# Patient Record
Sex: Male | Born: 1995 | Race: Black or African American | Hispanic: No | Marital: Single | Smoking: Never smoker
Health system: Southern US, Community
[De-identification: ages and names within clinical notes are randomized; demographics above are authoritative.]

## PROBLEM LIST (undated history)

## (undated) DIAGNOSIS — T7840XA Allergy, unspecified, initial encounter: Secondary | ICD-10-CM

## (undated) HISTORY — DX: Allergy, unspecified, initial encounter: T78.40XA

---
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
## 2020-02-13 ENCOUNTER — Inpatient Hospital Stay (HOSPITAL_COMMUNITY)
Admission: EM | Admit: 2020-02-13 | Discharge: 2020-02-16 | DRG: 558 | Disposition: A | Discharge status: Home / Self Care | Payer: No Typology Code available for payment source | Attending: Internal Medicine | Admitting: Internal Medicine

## 2020-02-13 ENCOUNTER — Encounter (HOSPITAL_COMMUNITY): Payer: Self-pay

## 2020-02-13 ENCOUNTER — Other Ambulatory Visit: Payer: Self-pay

## 2020-02-13 DIAGNOSIS — F17200 Nicotine dependence, unspecified, uncomplicated: Secondary | ICD-10-CM | POA: Diagnosis not present

## 2020-02-13 DIAGNOSIS — M6282 Rhabdomyolysis: Principal | ICD-10-CM

## 2020-02-13 DIAGNOSIS — Z20822 Contact with and (suspected) exposure to covid-19: Secondary | ICD-10-CM | POA: Diagnosis present

## 2020-02-13 DIAGNOSIS — R7989 Other specified abnormal findings of blood chemistry: Secondary | ICD-10-CM | POA: Diagnosis present

## 2020-02-13 DIAGNOSIS — R809 Proteinuria, unspecified: Secondary | ICD-10-CM | POA: Diagnosis present

## 2020-02-13 DIAGNOSIS — F1210 Cannabis abuse, uncomplicated: Secondary | ICD-10-CM | POA: Diagnosis present

## 2020-02-13 DIAGNOSIS — R748 Abnormal levels of other serum enzymes: Secondary | ICD-10-CM | POA: Diagnosis present

## 2020-02-13 DIAGNOSIS — F17290 Nicotine dependence, other tobacco product, uncomplicated: Secondary | ICD-10-CM | POA: Diagnosis present

## 2020-02-13 LAB — HEPATITIS PANEL, ACUTE
HCV Ab: NONREACTIVE
Hep A IgM: NONREACTIVE
Hep B C IgM: NONREACTIVE
Hepatitis B Surface Ag: NONREACTIVE

## 2020-02-13 LAB — CBC
HCT: 40.6 % (ref 39.0–52.0)
Hemoglobin: 13.8 g/dL (ref 13.0–17.0)
MCH: 28.5 pg (ref 26.0–34.0)
MCHC: 34 g/dL (ref 30.0–36.0)
MCV: 83.9 fL (ref 80.0–100.0)
Platelets: 162 10*3/uL (ref 150–400)
RBC: 4.84 MIL/uL (ref 4.22–5.81)
RDW: 13.1 % (ref 11.5–15.5)
WBC: 7 10*3/uL (ref 4.0–10.5)
nRBC: 0 % (ref 0.0–0.2)

## 2020-02-13 LAB — CBC WITH DIFFERENTIAL/PLATELET
Abs Immature Granulocytes: 0.02 10*3/uL (ref 0.00–0.07)
Basophils Absolute: 0 10*3/uL (ref 0.0–0.1)
Basophils Relative: 0 %
Eosinophils Absolute: 0.1 10*3/uL (ref 0.0–0.5)
Eosinophils Relative: 1 %
HCT: 41.5 % (ref 39.0–52.0)
Hemoglobin: 14.3 g/dL (ref 13.0–17.0)
Immature Granulocytes: 0 %
Lymphocytes Relative: 28 %
Lymphs Abs: 1.9 10*3/uL (ref 0.7–4.0)
MCH: 28.5 pg (ref 26.0–34.0)
MCHC: 34.5 g/dL (ref 30.0–36.0)
MCV: 82.8 fL (ref 80.0–100.0)
Monocytes Absolute: 0.5 10*3/uL (ref 0.1–1.0)
Monocytes Relative: 7 %
Neutro Abs: 4.4 10*3/uL (ref 1.7–7.7)
Neutrophils Relative %: 64 %
Platelets: 236 10*3/uL (ref 150–400)
RBC: 5.01 MIL/uL (ref 4.22–5.81)
RDW: 13.2 % (ref 11.5–15.5)
WBC: 7 10*3/uL (ref 4.0–10.5)
nRBC: 0 % (ref 0.0–0.2)

## 2020-02-13 LAB — CREATININE, SERUM
Creatinine, Ser: 1.03 mg/dL (ref 0.61–1.24)
GFR calc Af Amer: >60 mL/min (ref >60)
GFR calc non Af Amer: >60 mL/min (ref >60)

## 2020-02-13 LAB — COMPREHENSIVE METABOLIC PANEL
ALT: 361 U/L — ABNORMAL HIGH (ref 0–44)
AST: 1449 U/L — ABNORMAL HIGH (ref 15–41)
Albumin: 4.2 g/dL (ref 3.5–5.0)
Alkaline Phosphatase: 59 U/L (ref 38–126)
Anion gap: 12 (ref 5–15)
BUN: 13 mg/dL (ref 6–20)
CO2: 25 mmol/L (ref 22–32)
Calcium: 8.9 mg/dL (ref 8.9–10.3)
Chloride: 102 mmol/L (ref 98–111)
Creatinine, Ser: 1.03 mg/dL (ref 0.61–1.24)
GFR calc Af Amer: >60 mL/min (ref >60)
GFR calc non Af Amer: >60 mL/min (ref >60)
Glucose, Bld: 109 mg/dL — ABNORMAL HIGH (ref 70–99)
Potassium: 4.4 mmol/L (ref 3.5–5.1)
Sodium: 139 mmol/L (ref 135–145)
Total Bilirubin: 0.7 mg/dL (ref 0.3–1.2)
Total Protein: 7.8 g/dL (ref 6.5–8.1)

## 2020-02-13 LAB — URINALYSIS, ROUTINE W REFLEX MICROSCOPIC
Bilirubin Urine: NEGATIVE
Glucose, UA: NEGATIVE mg/dL
Ketones, ur: NEGATIVE mg/dL
Leukocytes,Ua: NEGATIVE
Nitrite: NEGATIVE
Protein, ur: 100 mg/dL — AB
Specific Gravity, Urine: 1.023 (ref 1.005–1.030)
pH: 6 (ref 5.0–8.0)

## 2020-02-13 LAB — RESPIRATORY PANEL BY RT PCR (FLU A&B, COVID)
Influenza A by PCR: NEGATIVE
Influenza B by PCR: NEGATIVE
SARS Coronavirus 2 by RT PCR: NEGATIVE

## 2020-02-13 LAB — CK: Total CK: >50000 U/L — ABNORMAL HIGH (ref 49–397)

## 2020-02-13 LAB — HIV ANTIBODY (ROUTINE TESTING W REFLEX): HIV Screen 4th Generation wRfx: NONREACTIVE

## 2020-02-13 MED ORDER — NICOTINE 14 MG/24HR TD PT24
14.0000 mg | MEDICATED_PATCH | Freq: Every day | TRANSDERMAL | Status: DC
  Filled 2020-02-13 (×2): qty 1

## 2020-02-13 MED ORDER — SODIUM CHLORIDE 0.9 % IV SOLN: INTRAVENOUS | Status: DC

## 2020-02-13 MED ORDER — ENOXAPARIN SODIUM 60 MG/0.6ML ~~LOC~~ SOLN
0.5000 mg/kg | SUBCUTANEOUS | Status: DC
  Administered 2020-02-13 – 2020-02-14 (×2): 60 mg via SUBCUTANEOUS
  Filled 2020-02-13 (×3): qty 0.6

## 2020-02-13 MED ORDER — SODIUM CHLORIDE 0.9 % IV BOLUS
1000.0000 mL | Freq: Once | INTRAVENOUS | Status: AC
  Administered 2020-02-13: 1000 mL via INTRAVENOUS

## 2020-02-13 MED ORDER — ONDANSETRON HCL 4 MG/2ML IJ SOLN: 4.0000 mg | Freq: Four times a day (QID) | INTRAMUSCULAR | Status: DC | PRN

## 2020-02-13 MED ORDER — ONDANSETRON HCL 4 MG PO TABS: 4.0000 mg | ORAL_TABLET | Freq: Four times a day (QID) | ORAL | Status: DC | PRN

## 2020-02-13 NOTE — ED Provider Notes (Signed)
[facility]-EMERGENCY DEPT Provider Note   CSN: [id number] Arrival date & time: 02/13/20  0865     History Chief Complaint  Patient presents with  . Muscle Pain    Gregg Lowery is a 24 y.o. male who presents to ED with a chief complaint of myalgias and hematuria.  States that he did a strenuous workout 3 days ago after a long time.  He reports waking up the next day with diffuse myalgias and started having hematuria since yesterday.  States that he used to play football and had similar symptoms in the past but "not as severe."  He denies specific chest pain, abdominal pain, vomiting, diarrhea or shortness of breath.  He feels that he is staying adequately hydrated.  Denies any injuries or falls, cough or sick contacts with similar symptoms.  HPI     Past Medical History:  Diagnosis Date  . Allergy     Patient Active Problem List   Diagnosis Date Noted  . 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 ORCHIOPEXY Bilateral 06/24/2014   Procedure: ORCHIOPEXY ADULT;  Surgeon: Martina Sinner, MD;  Location: [facility];  Service: Urology;  Laterality: Bilateral;  . SCROTAL EXPLORATION Bilateral 06/24/2014   Procedure: SCROTUM EXPLORATION;  Surgeon: Martina Sinner, MD;  Location: [facility];  Service: Urology;  Laterality: Bilateral;       No family history on file.  Social History   Tobacco Use  . Smoking status: Never Smoker  . Smokeless tobacco: Never Used  Substance Use Topics  . Alcohol use: No  . Drug use: No    Home Medications Prior to Admission medications   Not on File    Allergies    Patient has no known allergies.  Review of Systems   Review of Systems  Constitutional: Negative for appetite change, chills and fever.  HENT: Negative for ear pain, rhinorrhea, sneezing and sore throat.   Eyes: Negative for photophobia and visual disturbance.  Respiratory: Negative for cough, chest tightness,  shortness of breath and wheezing.   Cardiovascular: Negative for chest pain and palpitations.  Gastrointestinal: Negative for abdominal pain, blood in stool, constipation, diarrhea, nausea and vomiting.  Genitourinary: Positive for hematuria. Negative for dysuria and urgency.  Musculoskeletal: Positive for myalgias.  Skin: Negative for rash.  Neurological: Negative for dizziness, weakness and light-headedness.    Physical Exam Updated Vital Signs BP (!) 142/90 (BP Location: Right Arm)   Pulse 76   Temp 98.4 F (36.9 C) (Oral)   Resp 17   SpO2 100%   Physical Exam Vitals and nursing note reviewed.  Constitutional:      General: He is not in acute distress.    Appearance: He is well-developed.  HENT:     Head: Normocephalic and atraumatic.     Nose: Nose normal.  Eyes:     General: No scleral icterus.       Right eye: No discharge.        Left eye: No discharge.     Conjunctiva/sclera: Conjunctivae normal.  Cardiovascular:     Rate and Rhythm: Normal rate and regular rhythm.     Heart sounds: Normal heart sounds. No murmur heard.  No friction rub. No gallop.   Pulmonary:     Effort: Pulmonary effort is normal. No respiratory distress.     Breath sounds: Normal breath sounds.  Abdominal:     General: Bowel sounds are normal. There  is no distension.     Palpations: Abdomen is soft.     Tenderness: There is no abdominal tenderness. There is no guarding.  Musculoskeletal:        General: Normal range of motion.     Cervical back: Normal range of motion and neck supple.  Skin:    General: Skin is warm and dry.     Findings: No rash.  Neurological:     Mental Status: He is alert.     Motor: No abnormal muscle tone.     Coordination: Coordination normal.     ED Results / Procedures / Treatments   Labs (all labs ordered are listed, but only abnormal results are displayed) Labs Reviewed  URINALYSIS, ROUTINE W REFLEX MICROSCOPIC - Abnormal; Notable for the following  components:      Result Value   Hgb urine dipstick LARGE (*)    Protein, ur 100 (*)    Bacteria, UA RARE (*)    All other components within normal limits  CK - Abnormal; Notable for the following components:   Total CK >50,000 (*)    All other components within normal limits  COMPREHENSIVE METABOLIC PANEL - Abnormal; Notable for the following components:   Glucose, Bld 109 (*)    AST 1,449 (*)    ALT 361 (*)    All other components within normal limits  RESPIRATORY PANEL BY RT PCR (FLU A&B, COVID)  CBC WITH DIFFERENTIAL/PLATELET  HEPATITIS PANEL, ACUTE    EKG None  Radiology No results found.  Procedures Procedures (including critical care time)  Medications Ordered in ED Medications  sodium chloride 0.9 % bolus 1,000 mL (has no administration in time range)  sodium chloride 0.9 % bolus 1,000 mL (1,000 mLs Intravenous New Bag/Given (Non-Interop) 02/13/20 1022)    ED Course  I have reviewed the triage vital signs and the nursing notes.  Pertinent labs & imaging results that were available during my care of the patient were reviewed by me and considered in my medical decision making (see chart for details).  Clinical Course as of Feb 12 1145  Fri Feb 13, 2020  1139 AST(!): 1,449 [HK]  1139 ALT(!): 361 [HK]  1139 CK Total(!): >50,000 [HK]    Clinical Course User Index [HK] Dietrich Pates, PA-C   MDM Rules/Calculators/A&P                          24 year old male with a chief complaint of myalgias and hematuria.  Did a strenuous workout 3 days ago after not working out for a long time.  Woke up the next day with diffuse myalgias and hematuria since yesterday.  Denies chest pain, abdominal pain, vomiting or fever.  On exam lungs are clear to auscultation bilaterally.  Vital signs without any abnormalities.  Lab work here significant for AST of 1400, ALT of 360.  BUN/creatinine are normal here.  Urinalysis with hemoglobin.  CK greater than 50,000.  CBC is unremarkable.  I  have obtained a hepatitis panel but I feel that his transaminitis is more so related to his rhabdomyolysis.  I have ordered IV fluids and patient will need to be admitted for ongoing management of his rhabdomyolysis.  Portions of this note were generated with Scientist, clinical (histocompatibility and immunogenetics). Dictation errors may occur despite best attempts at proofreading.  Final Clinical Impression(s) / ED Diagnoses Final diagnoses:  Non-traumatic rhabdomyolysis    Rx / DC Orders ED Discharge Orders  None       Dietrich Pates, PA-C 02/13/20 1146    Linwood Dibbles, MD 02/13/20 [id number]

## 2020-02-13 NOTE — ED Triage Notes (Signed)
Patient arrived stating that on Tuesday morning he started working out again, reports dark colored urine and muscle soreness.

## 2020-02-13 NOTE — H&P (Signed)
.  History and Physical    Gregg Lowery CWC:[id number] DOB: 1995/06/23 DOA: 02/13/2020  PCP: Pcp, No  Patient coming from: Home  Chief Complaint: Muscle pain, fatigue, dark urine.  HPI: Gregg Lowery is a 24 y.o. male with no previous medical history. Present to hospital w/ 24 hours or muscle pain and fatigue. He reports 2 days ago he attended a vigorous work out session with is cousin. He seemed to feel fine the first day, but woke up yesterday with extreme fatigue and pain in his upper torso. I dismissed it as post-workout pain and tried to go to work. However, he was unable to tolerate the pain. He went home and took some ibuprofen, which helped a small amount. This morning he found himself with continued soreness. He reports that he noticed last night that his urine had become dark brown. This continued through this morning. He became concerned that something may be wrong; so, he looked up his symptoms online. He then decided to come to the ED. He denies any other alleviating or aggravating factors.   ED Course: Labwork showed elevated LFTs and CPK. TRH was called for admission.   Review of Systems: Denies CP, N, V, ab pain, dyspnea, syncopal episodes. Review of systems is otherwise negative for all not mentioned in HPI.   Past Medical History:  Diagnosis Date  . Allergy     Past Surgical History:  Procedure Laterality Date  . ORCHIOPEXY Bilateral 06/24/2014   Procedure: ORCHIOPEXY ADULT;  Surgeon: Martina Sinner, MD;  Location: [facility];  Service: Urology;  Laterality: Bilateral;  . SCROTAL EXPLORATION Bilateral 06/24/2014   Procedure: SCROTUM EXPLORATION;  Surgeon: Martina Sinner, MD;  Location: [facility];  Service: Urology;  Laterality: Bilateral;     reports that he has never smoked. He has never used smokeless tobacco. He reports that he does not drink alcohol and does not use drugs.  No Known Allergies  No family history on file.  Prior to Admission medications   Not  on File    Physical Exam: Vitals:   02/13/20 0704 02/13/20 1201  BP: (!) 142/90 (!) 144/83  Pulse: 76 81  Resp: 17 15  Temp: 98.4 F (36.9 C)   TempSrc: Oral   SpO2: 100% 100%    General: 24 y.o. male resting in bed in NAD Eyes: PERRL, normal sclera ENMT: Nares patent w/o discharge, orophaynx clear, dentition normal, ears w/o discharge/lesions/ulcers Neck: Supple, trachea midline Cardiovascular: RRR, +S1, S2, no m/g/r, equal pulses throughout Respiratory: CTABL, no w/r/r, normal WOB GI: BS+, NDNT, no masses noted, no organomegaly noted MSK: No e/c/c Skin: No rashes, bruises, ulcerations noted Neuro: A&O x 3, no focal deficits Psyc: Appropriate interaction and affect, calm/cooperative  Labs on Admission: I have personally reviewed following labs and imaging studies  CBC: Recent Labs  Lab 02/13/20 1020  WBC 7.0  NEUTROABS 4.4  HGB 14.3  HCT 41.5  MCV 82.8  PLT 236   Basic Metabolic Panel: Recent Labs  Lab 02/13/20 1020  NA 139  K 4.4  CL 102  CO2 25  GLUCOSE 109*  BUN 13  CREATININE 1.03  CALCIUM 8.9   GFR: CrCl cannot be calculated (Unknown ideal weight.). Liver Function Tests: Recent Labs  Lab 02/13/20 1020  AST 1,449*  ALT 361*  ALKPHOS 59  BILITOT 0.7  PROT 7.8  ALBUMIN 4.2   No results for input(s): LIPASE, AMYLASE in the last 168 hours. No results for input(s): AMMONIA in  the last 168 hours. Coagulation Profile: No results for input(s): INR, PROTIME in the last 168 hours. Cardiac Enzymes: Recent Labs  Lab 02/13/20 1020  CKTOTAL >50,000*   BNP (last 3 results) No results for input(s): PROBNP in the last 8760 hours. HbA1C: No results for input(s): HGBA1C in the last 72 hours. CBG: No results for input(s): GLUCAP in the last 168 hours. Lipid Profile: No results for input(s): CHOL, HDL, LDLCALC, TRIG, CHOLHDL, LDLDIRECT in the last 72 hours. Thyroid Function Tests: No results for input(s): TSH, T4TOTAL, FREET4, T3FREE, THYROIDAB in  the last 72 hours. Anemia Panel: No results for input(s): VITAMINB12, FOLATE, FERRITIN, TIBC, IRON, RETICCTPCT in the last 72 hours. Urine analysis:    Component Value Date/Time   COLORURINE YELLOW 02/13/2020 0956   APPEARANCEUR CLEAR 02/13/2020 0956   LABSPEC 1.023 02/13/2020 0956   PHURINE 6.0 02/13/2020 0956   GLUCOSEU NEGATIVE 02/13/2020 0956   HGBUR LARGE (A) 02/13/2020 0956   BILIRUBINUR NEGATIVE 02/13/2020 0956   BILIRUBINUR neg 01/02/2012 1322   KETONESUR NEGATIVE 02/13/2020 0956   PROTEINUR 100 (A) 02/13/2020 0956   UROBILINOGEN 1.0 06/23/2014 2253   NITRITE NEGATIVE 02/13/2020 0956   LEUKOCYTESUR NEGATIVE 02/13/2020 0956    Radiological Exams on Admission: No results found.  Assessment/Plan Rhabdomyolysis     - admit to inpatient, med-tele     - fluids     - trend CPK  Elevated LFTs     - likely secondary to rhabdo; hep panel is pending; trend for now  Proteinuria     - likely secondary to rhabdo; can repeat UA once rhabdo improved; if persistent, improve BP control and follow outpt with PCP  Nicotine dependence     - vapes nicotine; cessation counseled  Marijuana abuse     - cessation counseled  DVT prophylaxis: lovenox  Code Status: FULL  Family Communication: None at bedside.  Consults called: None  Admission status: Inpatient d/t severity of illness and need for IVF.   Status is: Inpatient  Remains inpatient appropriate because:IV treatments appropriate due to intensity of illness.  Dispo: The patient is from: Home              Anticipated d/c is to: Home              Anticipated d/c date is: 3 days              Patient currently is not medically stable to d/c.  Teddy Spike DO [facility]  If 7PM-7AM, please contact night-coverage [email or web address]  02/13/2020, 12:46 PM

## 2020-02-13 NOTE — ED Notes (Signed)
ED TO INPATIENT HANDOFF REPORT  Name/Age/Gender Gregg Lowery 24 y.o. male  Code Status Code Status History    Date Active Date Inactive Code Status Order ID Comments User Context   06/24/2014 2112 06/25/2014 1301 Full Code [id number]  Martina Sinner, MD Inpatient   Advance Care Planning Activity    Questions for Most Recent Historical Code Status (Order [id number])       Home/SNF/Other Home  Chief Complaint Rhabdomyolysis [M62.82]  Level of Care/Admitting Diagnosis ED Disposition    ED Disposition Condition Comment   Admit  Hospital Area: [facility] [[id number]]  Level of Care: Med-Surg [16]  May admit patient to [facility] or [facility] if equivalent level of care is available:: No  Covid Evaluation: Asymptomatic Screening Protocol (No Symptoms)  Diagnosis: Rhabdomyolysis [728.88.ICD-9-CM]  Admitting Physician: Teddy Spike [[id number]]  Attending Physician: Teddy Spike [[id number]]  Estimated length of stay: past midnight tomorrow  Certification:: I certify this patient will need inpatient services for at least 2 midnights       Medical History Past Medical History:  Diagnosis Date  . Allergy     Allergies No Known Allergies  IV Location/Drains/Wounds Patient Lines/Drains/Airways Status    Active Line/Drains/Airways    Name Placement date Placement time Site Days   Peripheral IV 02/13/20 Left Antecubital 02/13/20  1022  Antecubital  less than 1   Incision (Closed) 06/24/14 Perineum Other (Comment) 06/24/14  1831   2060          Labs/Imaging Results for orders placed or performed during the hospital encounter of 02/13/20 (from the past 48 hour(s))  Urinalysis, Routine w reflex microscopic     Status: Abnormal   Collection Time: 02/13/20  9:56 AM  Result Value Ref Range   Color, Urine YELLOW YELLOW   APPearance CLEAR CLEAR   Specific Gravity, Urine 1.023 1.005 - 1.030   pH 6.0 5.0 - 8.0   Glucose, UA NEGATIVE NEGATIVE mg/dL   Hgb  urine dipstick LARGE (A) NEGATIVE   Bilirubin Urine NEGATIVE NEGATIVE   Ketones, ur NEGATIVE NEGATIVE mg/dL   Protein, ur 867 (A) NEGATIVE mg/dL   Nitrite NEGATIVE NEGATIVE   Leukocytes,Ua NEGATIVE NEGATIVE   RBC / HPF 0-5 0 - 5 RBC/hpf   WBC, UA 0-5 0 - 5 WBC/hpf   Bacteria, UA RARE (A) NONE SEEN   Squamous Epithelial / LPF 0-5 0 - 5   Mucus PRESENT     Comment: Performed at [facility], [address], [place], [place] [address]  [place]     Status: Abnormal   Collection Time: 02/13/20 10:20 AM  Result Value Ref Range   Total CK >50,000 (H) 49.0 - 397.0 U/L    Comment: RESULTS CONFIRMED BY MANUAL DILUTION Performed at [facility], [address], [place], [place] [address]   CBC with Differential     Status: None   Collection Time: 02/13/20 10:20 AM  Result Value Ref Range   WBC 7.0 4.0 - 10.5 K/uL   RBC 5.01 4.22 - 5.81 MIL/uL   Hemoglobin 14.3 13.0 - 17.0 g/dL   HCT 28.3 39 - 52 %   MCV 82.8 80.0 - 100.0 fL   MCH 28.5 26.0 - 34.0 pg   MCHC 34.5 30.0 - 36.0 g/dL   RDW 66.2 94.7 - 65.4 %   Platelets 236 150 - 400 K/uL   nRBC 0.0 0.0 - 0.2 %   Neutrophils Relative % 64 %  Neutro Abs 4.4 1.7 - 7.7 K/uL   Lymphocytes Relative 28 %   Lymphs Abs 1.9 0.7 - 4.0 K/uL   Monocytes Relative 7 %   Monocytes Absolute 0.5 0 - 1 K/uL   Eosinophils Relative 1 %   Eosinophils Absolute 0.1 0 - 0 K/uL   Basophils Relative 0 %   Basophils Absolute 0.0 0 - 0 K/uL   Immature Granulocytes 0 %   Abs Immature Granulocytes 0.02 0.00 - 0.07 K/uL    Comment: Performed at [facility], [address], [place], [place] [address]  Comprehensive metabolic panel     Status: Abnormal   Collection Time: 02/13/20 10:20 AM  Result Value Ref Range   Sodium 139 135 - 145 mmol/L   Potassium 4.4 3.5 - 5.1 mmol/L   Chloride 102 98 - 111 mmol/L   CO2 25 22 - 32 mmol/L   Glucose, Bld 109 (H) 70 - 99 mg/dL    Comment: Glucose reference range applies  only to samples taken after fasting for at least 8 hours.   BUN 13 6 - 20 mg/dL   Creatinine, Ser 6.07 0.61 - 1.24 mg/dL   Calcium 8.9 8.9 - 37.1 mg/dL   Total Protein 7.8 6.5 - 8.1 g/dL   Albumin 4.2 3.5 - 5.0 g/dL   AST 0,626 (H) 15 - 41 U/L   ALT 361 (H) 0 - 44 U/L   Alkaline Phosphatase 59 38 - 126 U/L   Total Bilirubin 0.7 0.3 - 1.2 mg/dL   GFR calc non Af Amer >60 >60 mL/min   GFR calc Af Amer >60 >60 mL/min   Anion gap 12 5 - 15    Comment: Performed at [facility], [address], [place], [place] [address]   No results found.  Pending Labs [employer] (From admission, onward)          Start     Ordered   02/13/20 1145  Respiratory Panel by RT PCR (Flu A&B, Covid) - Nasopharyngeal Swab  (Tier 2 (TAT 2 hrs))  Once,   STAT       Question Answer Comment  Is this test for diagnosis or screening Screening   Symptomatic for COVID-19 as defined by CDC No   Hospitalized for COVID-19 No   Admitted to ICU for COVID-19 No   Previously tested for COVID-19 No   Resident in a congregate (group) care setting No   Employed in healthcare setting No   Has patient completed COVID vaccination(s) (2 doses of Pfizer/Moderna 1 dose of [employer]) Unknown      02/13/20 1144   02/13/20 1140  Hepatitis panel, acute  ONCE - STAT,   STAT        02/13/20 1139          Vitals/Pain Today's Vitals   02/13/20 0703 02/13/20 0704 02/13/20 1201  BP:  (!) 142/90 (!) 144/83  Pulse:  76 81  Resp:  17 15  Temp:  98.4 F (36.9 C)   TempSrc:  Oral   SpO2:  100% 100%  PainSc: 7       Isolation Precautions No active isolations  Medications Medications  sodium chloride 0.9 % bolus 1,000 mL (0 mLs Intravenous Stopped 02/13/20 1209)  sodium chloride 0.9 % bolus 1,000 mL (1,000 mLs Intravenous New Bag/Given (Non-Interop) 02/13/20 1209)    Mobility walks

## 2020-02-14 LAB — CBC
HCT: 39.1 % (ref 39.0–52.0)
Hemoglobin: 13.4 g/dL (ref 13.0–17.0)
MCH: 28.6 pg (ref 26.0–34.0)
MCHC: 34.3 g/dL (ref 30.0–36.0)
MCV: 83.5 fL (ref 80.0–100.0)
Platelets: 197 10*3/uL (ref 150–400)
RBC: 4.68 MIL/uL (ref 4.22–5.81)
RDW: 13.2 % (ref 11.5–15.5)
WBC: 6.9 10*3/uL (ref 4.0–10.5)
nRBC: 0 % (ref 0.0–0.2)

## 2020-02-14 LAB — RAPID URINE DRUG SCREEN, HOSP PERFORMED
Amphetamines: NOT DETECTED
Barbiturates: NOT DETECTED
Benzodiazepines: NOT DETECTED
Cocaine: NOT DETECTED
Opiates: NOT DETECTED
Tetrahydrocannabinol: POSITIVE — AB

## 2020-02-14 LAB — COMPREHENSIVE METABOLIC PANEL
ALT: 372 U/L — ABNORMAL HIGH (ref 0–44)
AST: 1524 U/L — ABNORMAL HIGH (ref 15–41)
Albumin: 3.5 g/dL (ref 3.5–5.0)
Alkaline Phosphatase: 47 U/L (ref 38–126)
Anion gap: 8 (ref 5–15)
BUN: 10 mg/dL (ref 6–20)
CO2: 24 mmol/L (ref 22–32)
Calcium: 8.6 mg/dL — ABNORMAL LOW (ref 8.9–10.3)
Chloride: 108 mmol/L (ref 98–111)
Creatinine, Ser: 0.85 mg/dL (ref 0.61–1.24)
GFR calc Af Amer: >60 mL/min (ref >60)
GFR calc non Af Amer: >60 mL/min (ref >60)
Glucose, Bld: 96 mg/dL (ref 70–99)
Potassium: 4.2 mmol/L (ref 3.5–5.1)
Sodium: 140 mmol/L (ref 135–145)
Total Bilirubin: 0.4 mg/dL (ref 0.3–1.2)
Total Protein: 6.6 g/dL (ref 6.5–8.1)

## 2020-02-14 LAB — CK: Total CK: >50000 U/L — ABNORMAL HIGH (ref 49–397)

## 2020-02-14 NOTE — Progress Notes (Signed)
PROGRESS NOTE    Gregg Lowery  PJA:[id number] DOB: 1995-08-17 DOA: 02/13/2020 PCP: Pcp, No    Brief Narrative:Gregg Lowery is a 24 y.o. male with no previous medical history. Present to hospital w/ 24 hours or muscle pain and fatigue. He reports 2 days ago he attended a vigorous work out session with is cousin. He seemed to feel fine the first day, but woke up yesterday with extreme fatigue and pain in his upper torso. I dismissed it as post-workout pain and tried to go to work. However, he was unable to tolerate the pain. He went home and took some ibuprofen, which helped a small amount. This morning he found himself with continued soreness. He reports that he noticed last night that his urine had become dark brown. This continued through this morning. He became concerned that something may be wrong; so, he looked up his symptoms online. He then decided to come to the ED. He denies any other alleviating or aggravating factors.   ED Course: Labwork showed elevated LFTs and CPK. TRH was called for admission.   Assessment & Plan:   Active Problems:   Rhabdomyolysis   #1  Rhabdomyolysis from aggressive workout with elevated CPK over 50,000 with high LFTs.  Continue IV fluids at 150 cc an hour.  Urine is clearing up.  Follow-up labs in a.m.  If improving will consider discharge in a.m. UA with proteinuria likely secondary to rhabdo.  Patient does not have a primary care physician however he is willing and capable of finding a PCP who will be in his insurance system.  #2 nicotine dependence-advised to quit  #3 substance abuse-thc positive  Estimated body mass index is 41.51 kg/m as calculated from the following:   Height as of this encounter: 5\' 7"  (1.702 m).   Weight as of this encounter: 120.2 kg.  DVT prophylaxis: lovenox Code Status: full Family Communication:none at bedside Disposition Plan:  Status is: Inpatient   Dispo: The patient is from: Home              Anticipated  d/c is to: Home              Anticipated d/c date is: 1 day              Patient currently is not medically stable to d/c.   Consultants: none  Procedures:none Antimicrobials:  none Subjective: Better no new c/o  Objective: Vitals:   02/13/20 1406 02/13/20 2118 02/14/20 0525 02/14/20 0809  BP:  135/63 (!) 165/80 (!) 165/80  Pulse:  74 68 68  Resp:  18 18 18   Temp:  98.2 F (36.8 C) 97.9 F (36.6 C) 97.9 F (36.6 C)  TempSrc:  Oral  Oral  SpO2:  100% 100%   Weight: 120 kg   120.2 kg  Height:    5\' 7"  (1.702 m)    Intake/Output Summary (Last 24 hours) at 02/14/2020 1340 Last data filed at 02/14/2020 1200 Gross per 24 hour  Intake 6909.79 ml  Output 3725 ml  Net 3184.79 ml   Filed Weights   02/13/20 1406 02/14/20 0809  Weight: 120 kg 120.2 kg    Examination:  General exam: Appears calm and comfortable  Respiratory system: Clear to auscultation. Respiratory effort normal. Cardiovascular system: S1 & S2 heard, RRR. No JVD, murmurs, rubs, gallops or clicks. No pedal edema. Gastrointestinal system: Abdomen is nondistended, soft and nontender. No organomegaly or masses felt. Normal bowel sounds heard. Central nervous system: Alert  and oriented. No focal neurological deficits. Extremities: Symmetric 5 x 5 power. Skin: No rashes, lesions or ulcers Psychiatry: Judgement and insight appear normal. Mood & affect appropriate.     Data Reviewed: I have personally reviewed following labs and imaging studies  CBC: Recent Labs  Lab 02/13/20 1020 02/13/20 1315 02/14/20 0349  WBC 7.0 7.0 6.9  NEUTROABS 4.4  --   --   HGB 14.3 13.8 13.4  HCT 41.5 40.6 39.1  MCV 82.8 83.9 83.5  PLT 236 162 197   Basic Metabolic Panel: Recent Labs  Lab 02/13/20 1020 02/13/20 1315 02/14/20 0349  NA 139  --  140  K 4.4  --  4.2  CL 102  --  108  CO2 25  --  24  GLUCOSE 109*  --  96  BUN 13  --  10  CREATININE 1.03 1.03 0.85  CALCIUM 8.9  --  8.6*   GFR: Estimated Creatinine  Clearance: 169.1 mL/min (by C-G formula based on SCr of 0.85 mg/dL). Liver Function Tests: Recent Labs  Lab 02/13/20 1020 02/14/20 0349  AST 1,449* 1,524*  ALT 361* 372*  ALKPHOS 59 47  BILITOT 0.7 0.4  PROT 7.8 6.6  ALBUMIN 4.2 3.5   No results for input(s): LIPASE, AMYLASE in the last 168 hours. No results for input(s): AMMONIA in the last 168 hours. Coagulation Profile: No results for input(s): INR, PROTIME in the last 168 hours. Cardiac Enzymes: Recent Labs  Lab 02/13/20 1020 02/14/20 0349  CKTOTAL >50,000* >50,000*   BNP (last 3 results) No results for input(s): PROBNP in the last 8760 hours. HbA1C: No results for input(s): HGBA1C in the last 72 hours. CBG: No results for input(s): GLUCAP in the last 168 hours. Lipid Profile: No results for input(s): CHOL, HDL, LDLCALC, TRIG, CHOLHDL, LDLDIRECT in the last 72 hours. Thyroid Function Tests: No results for input(s): TSH, T4TOTAL, FREET4, T3FREE, THYROIDAB in the last 72 hours. Anemia Panel: No results for input(s): VITAMINB12, FOLATE, FERRITIN, TIBC, IRON, RETICCTPCT in the last 72 hours. Sepsis Labs: No results for input(s): PROCALCITON, LATICACIDVEN in the last 168 hours.  Recent Results (from the past 240 hour(s))  Respiratory Panel by RT PCR (Flu A&B, Covid) - Nasopharyngeal Swab     Status: None   Collection Time: 02/13/20 12:05 PM   Specimen: Nasopharyngeal Swab  Result Value Ref Range Status   SARS Coronavirus 2 by RT PCR NEGATIVE NEGATIVE Final    Comment: (NOTE) SARS-CoV-2 target nucleic acids are NOT DETECTED.  The SARS-CoV-2 RNA is generally detectable in upper respiratoy specimens during the acute phase of infection. The lowest concentration of SARS-CoV-2 viral copies this assay can detect is 131 copies/mL. A negative result does not preclude SARS-Cov-2 infection and should not be used as the sole basis for treatment or other patient management decisions. A negative result may occur with  improper  specimen collection/handling, submission of specimen other than nasopharyngeal swab, presence of viral mutation(s) within the areas targeted by this assay, and inadequate number of viral copies (<131 copies/mL). A negative result must be combined with clinical observations, patient history, and epidemiological information. The expected result is Negative.  Fact Sheet for Patients:  https://www.moore.com/  Fact Sheet for Healthcare Providers:  https://www.young.biz/  This test is no t yet approved or cleared by the [place] FDA and  has been authorized for detection and/or diagnosis of SARS-CoV-2 by FDA under an Emergency Use Authorization (EUA). This EUA will remain  in effect (meaning this test  can be used) for the duration of the COVID-19 declaration under Section 564(b)(1) of the Act, 21 U.S.C. section 360bbb-3(b)(1), unless the authorization is terminated or revoked sooner.     Influenza A by PCR NEGATIVE NEGATIVE Final   Influenza B by PCR NEGATIVE NEGATIVE Final    Comment: (NOTE) The Xpert Xpress SARS-CoV-2/FLU/RSV assay is intended as an aid in  the diagnosis of influenza from Nasopharyngeal swab specimens and  should not be used as a sole basis for treatment. Nasal washings and  aspirates are unacceptable for Xpert Xpress SARS-CoV-2/FLU/RSV  testing.  Fact Sheet for Patients: https://www.moore.com/  Fact Sheet for Healthcare Providers: https://www.young.biz/  This test is not yet approved or cleared by the [place] FDA and  has been authorized for detection and/or diagnosis of SARS-CoV-2 by  FDA under an Emergency Use Authorization (EUA). This EUA will remain  in effect (meaning this test can be used) for the duration of the  Covid-19 declaration under Section 564(b)(1) of the Act, 21  U.S.C. section 360bbb-3(b)(1), unless the authorization is  terminated or revoked. Performed at  [facility], [address] [place], [place] [address]          Radiology Studies: No results found.      Scheduled Meds: . enoxaparin (LOVENOX) injection  0.5 mg/kg Subcutaneous Q24H  . nicotine  14 mg Transdermal Daily   Continuous Infusions: . sodium chloride 150 mL/hr at 02/14/20 0806     LOS: 1 day     Alwyn Ren, MD 02/14/2020, 1:40 PM

## 2020-02-14 NOTE — Plan of Care (Signed)

## 2020-02-14 NOTE — TOC Progression Note (Signed)
Transition of Care [facility]) - Progression Note    Patient Details  Name: Gregg Lowery MRN: [id number] Date of Birth: May 22, 1996  Transition of [facility]) CM/SW Contact  Lennart Pall, LCSW Phone Number: 02/14/2020, 11:29 AM  Clinical Narrative:    Received referral to assist pt with PCP needs.  Met with pt and discussed/ educated on need to contact his insurance carrier (provided him with phone #) to determine "in network" providers and then make an appointment.  Pt notes he is "not sure" about his continued employment status and may not be able to keep insurance.  Provided him with information on [place] should he lose ins coverage and need alternative for PCP coverage.  No further TOC needs identified.        Expected Discharge Plan and Services                                                 Social Determinants of Health (SDOH) Interventions    Readmission Risk Interventions Readmission Risk Prevention Plan 02/14/2020  Post Dischage Appt Complete  Medication Screening Complete  Transportation Screening Complete  Some recent data might be hidden

## 2020-02-14 NOTE — Plan of Care (Signed)
  Problem: Clinical Measurements: Goal: Diagnostic test results will improve Outcome: Progressing   Problem: Education: Goal: Knowledge of General Education information will improve Description: Including pain rating scale, medication(s)/side effects and non-pharmacologic comfort measures Outcome: Progressing   

## 2020-02-15 LAB — COMPREHENSIVE METABOLIC PANEL
ALT: 443 U/L — ABNORMAL HIGH (ref 0–44)
AST: 1443 U/L — ABNORMAL HIGH (ref 15–41)
Albumin: 3.8 g/dL (ref 3.5–5.0)
Alkaline Phosphatase: 51 U/L (ref 38–126)
Anion gap: 11 (ref 5–15)
BUN: 7 mg/dL (ref 6–20)
CO2: 25 mmol/L (ref 22–32)
Calcium: 9.2 mg/dL (ref 8.9–10.3)
Chloride: 105 mmol/L (ref 98–111)
Creatinine, Ser: 0.97 mg/dL (ref 0.61–1.24)
GFR calc Af Amer: >60 mL/min (ref >60)
GFR calc non Af Amer: >60 mL/min (ref >60)
Glucose, Bld: 95 mg/dL (ref 70–99)
Potassium: 3.9 mmol/L (ref 3.5–5.1)
Sodium: 141 mmol/L (ref 135–145)
Total Bilirubin: 1 mg/dL (ref 0.3–1.2)
Total Protein: 7.4 g/dL (ref 6.5–8.1)

## 2020-02-15 LAB — CK: Total CK: >50000 U/L — ABNORMAL HIGH (ref 49–397)

## 2020-02-15 NOTE — Progress Notes (Signed)
Patient is confused about why he is in the hospital. I explained that his lab work is not reassuring at this point to safely d/c and he still seems confused. Says he never saw an MD today. We all look the same he doesn't know who he saw. Can someone explain things to him better.

## 2020-02-15 NOTE — Progress Notes (Signed)
PROGRESS NOTE    Gregg Lowery  HYW:[id number] DOB: [birth year] DOA: 02/13/2020 PCP: Pcp, No    Brief Narrative:Gregg Lowery is a 24 y.o. male with no previous medical history. Present to hospital w/ 24 hours or muscle pain and fatigue. He reports 2 days ago he attended a vigorous work out session with is cousin. He seemed to feel fine the first day, but woke up yesterday with extreme fatigue and pain in his upper torso. I dismissed it as post-workout pain and tried to go to work. However, he was unable to tolerate the pain. He went home and took some ibuprofen, which helped a small amount. This morning he found himself with continued soreness. He reports that he noticed last night that his urine had become dark brown. This continued through this morning. He became concerned that something may be wrong; so, he looked up his symptoms online. He then decided to come to the ED. He denies any other alleviating or aggravating factors.   ED Course: Labwork showed elevated LFTs and CPK. [facility] was called for admission.   Assessment & Plan:   Active Problems:   Rhabdomyolysis   #1  Rhabdomyolysis from aggressive workout with elevated CPK over 50,000 with elevated LFTs. AST 1443 down from 1324 ALT 443 slightly up from 372. Continue IV fluids hepatitis panel is negative. Follow-up labs tomorrow. .  Urine is clearing up.  If improving will consider discharge in a.m. UA with proteinuria likely secondary to rhabdo.  Patient does not have a primary care physician however he is willing and capable of finding a PCP who will be in his insurance system.  #2 nicotine dependence-advised to quit  #3 substance abuse-thc positive  Estimated body mass index is 41.51 kg/m as calculated from the following:   Height as of this encounter: 5\' 7"  (1.702 m).   Weight as of this encounter: 120.2 kg.  DVT prophylaxis: lovenox Code Status: full Family Communication:none at bedside Disposition Plan:  Status is:  Inpatient   Dispo: The patient is from: Home              Anticipated d/c is to: Home              Anticipated d/c date is: 1 day              Patient currently is not medically stable to d/c.   Consultants: none  Procedures:none Antimicrobials:  none Subjective: He is awake resting in bed no specific complaints he is anxious to go home  Objective: Vitals:   02/14/20 0809 02/14/20 1346 02/14/20 2042 02/15/20 0500  BP: (!) 165/80 121/72 140/67 (!) 150/73  Pulse: 68 (!) 57 (!) 49 (!) 51  Resp: 18 18 16 16   Temp: 97.9 F (36.6 C) 98.2 F (36.8 C) 98.4 F (36.9 C) 97.8 F (36.6 C)  TempSrc: Oral Oral Oral Oral  SpO2:  100% 100% 99%  Weight: 120.2 kg     Height: 5\' 7"  (1.702 m)       Intake/Output Summary (Last 24 hours) at 02/15/2020 1201 Last data filed at 02/15/2020 1100 Gross per 24 hour  Intake 5358.28 ml  Output 3450 ml  Net 1908.28 ml   Filed Weights   02/13/20 1406 02/14/20 0809  Weight: 120 kg 120.2 kg    Examination:  General exam: Appears calm and comfortable  Respiratory system: Clear to auscultation. Respiratory effort normal. Cardiovascular system: S1 & S2 heard, RRR. No JVD, murmurs, rubs, gallops or clicks.  No pedal edema. Gastrointestinal system: Abdomen is nondistended, soft and nontender. No organomegaly or masses felt. Normal bowel sounds heard. Central nervous system: Alert and oriented. No focal neurological deficits. Extremities: Symmetric 5 x 5 power. Skin: No rashes, lesions or ulcers Psychiatry: Judgement and insight appear normal. Mood & affect appropriate.     Data Reviewed: I have personally reviewed following labs and imaging studies  CBC: Recent Labs  Lab 02/13/20 1020 02/13/20 1315 02/14/20 0349  WBC 7.0 7.0 6.9  NEUTROABS 4.4  --   --   HGB 14.3 13.8 13.4  HCT 41.5 40.6 39.1  MCV 82.8 83.9 83.5  PLT 236 162 197   Basic Metabolic Panel: Recent Labs  Lab 02/13/20 1020 02/13/20 1315 02/14/20 0349 02/15/20 0832  NA  139  --  140 141  K 4.4  --  4.2 3.9  CL 102  --  108 105  CO2 25  --  24 25  GLUCOSE 109*  --  96 95  BUN 13  --  10 7  CREATININE 1.03 1.03 0.85 0.97  CALCIUM 8.9  --  8.6* 9.2   GFR: Estimated Creatinine Clearance: 148.2 mL/min (by C-G formula based on SCr of 0.97 mg/dL). Liver Function Tests: Recent Labs  Lab 02/13/20 1020 02/14/20 0349 02/15/20 0832  AST 1,449* 1,524* 1,443*  ALT 361* 372* 443*  ALKPHOS 59 47 51  BILITOT 0.7 0.4 1.0  PROT 7.8 6.6 7.4  ALBUMIN 4.2 3.5 3.8   No results for input(s): LIPASE, AMYLASE in the last 168 hours. No results for input(s): AMMONIA in the last 168 hours. Coagulation Profile: No results for input(s): INR, PROTIME in the last 168 hours. Cardiac Enzymes: Recent Labs  Lab 02/13/20 1020 02/14/20 0349 02/15/20 0411  CKTOTAL >50,000* >50,000* >50,000*   BNP (last 3 results) No results for input(s): PROBNP in the last 8760 hours. HbA1C: No results for input(s): HGBA1C in the last 72 hours. CBG: No results for input(s): GLUCAP in the last 168 hours. Lipid Profile: No results for input(s): CHOL, HDL, LDLCALC, TRIG, CHOLHDL, LDLDIRECT in the last 72 hours. Thyroid Function Tests: No results for input(s): TSH, T4TOTAL, FREET4, T3FREE, THYROIDAB in the last 72 hours. Anemia Panel: No results for input(s): VITAMINB12, FOLATE, FERRITIN, TIBC, IRON, RETICCTPCT in the last 72 hours. Sepsis Labs: No results for input(s): PROCALCITON, LATICACIDVEN in the last 168 hours.  Recent Results (from the past 240 hour(s))  Respiratory Panel by RT PCR (Flu A&B, Covid) - Nasopharyngeal Swab     Status: None   Collection Time: 02/13/20 12:05 PM   Specimen: Nasopharyngeal Swab  Result Value Ref Range Status   SARS Coronavirus 2 by RT PCR NEGATIVE NEGATIVE Final    Comment: (NOTE) SARS-CoV-2 target nucleic acids are NOT DETECTED.  The SARS-CoV-2 RNA is generally detectable in upper respiratoy specimens during the acute phase of infection. The  lowest concentration of SARS-CoV-2 viral copies this assay can detect is 131 copies/mL. A negative result does not preclude SARS-Cov-2 infection and should not be used as the sole basis for treatment or other patient management decisions. A negative result may occur with  improper specimen collection/handling, submission of specimen other than nasopharyngeal swab, presence of viral mutation(s) within the areas targeted by this assay, and inadequate number of viral copies (<131 copies/mL). A negative result must be combined with clinical observations, patient history, and epidemiological information. The expected result is Negative.  Fact Sheet for Patients:  https://www.moore.com/  Fact Sheet for Healthcare Providers:  https://www.young.biz/  This test is no t yet approved or cleared by the [place] and  has been authorized for detection and/or diagnosis of SARS-CoV-2 by FDA under an Emergency Use Authorization (EUA). This EUA will remain  in effect (meaning this test can be used) for the duration of the COVID-19 declaration under Section 564(b)(1) of the Act, 21 U.S.C. section 360bbb-3(b)(1), unless the authorization is terminated or revoked sooner.     Influenza A by PCR NEGATIVE NEGATIVE Final   Influenza B by PCR NEGATIVE NEGATIVE Final    Comment: (NOTE) The Xpert Xpress SARS-CoV-2/FLU/RSV assay is intended as an aid in  the diagnosis of influenza from Nasopharyngeal swab specimens and  should not be used as a sole basis for treatment. Nasal washings and  aspirates are unacceptable for Xpert Xpress SARS-CoV-2/FLU/RSV  testing.  Fact Sheet for Patients: https://www.moore.com/  Fact Sheet for Healthcare Providers: https://www.young.biz/  This test is not yet approved or cleared by the [place] FDA and  has been authorized for detection and/or diagnosis of SARS-CoV-2 by  FDA under  an Emergency Use Authorization (EUA). This EUA will remain  in effect (meaning this test can be used) for the duration of the  Covid-19 declaration under Section 564(b)(1) of the Act, 21  U.S.C. section 360bbb-3(b)(1), unless the authorization is  terminated or revoked. Performed at [facility], [address], [place], [place] [address]          Radiology Studies: No results found.      Scheduled Meds: . enoxaparin (LOVENOX) injection  0.5 mg/kg Subcutaneous Q24H  . nicotine  14 mg Transdermal Daily   Continuous Infusions: . sodium chloride 150 mL/hr at 02/15/20 0600     LOS: 2 days     Alwyn Ren, MD 02/15/2020, 12:01 PM

## 2020-02-16 LAB — COMPREHENSIVE METABOLIC PANEL
ALT: 333 U/L — ABNORMAL HIGH (ref 0–44)
AST: 986 U/L — ABNORMAL HIGH (ref 15–41)
Albumin: 3.6 g/dL (ref 3.5–5.0)
Alkaline Phosphatase: 45 U/L (ref 38–126)
Anion gap: 9 (ref 5–15)
BUN: 8 mg/dL (ref 6–20)
CO2: 24 mmol/L (ref 22–32)
Calcium: 8.6 mg/dL — ABNORMAL LOW (ref 8.9–10.3)
Chloride: 106 mmol/L (ref 98–111)
Creatinine, Ser: 0.91 mg/dL (ref 0.61–1.24)
GFR calc Af Amer: >60 mL/min (ref >60)
GFR calc non Af Amer: >60 mL/min (ref >60)
Glucose, Bld: 78 mg/dL (ref 70–99)
Potassium: 3.7 mmol/L (ref 3.5–5.1)
Sodium: 139 mmol/L (ref 135–145)
Total Bilirubin: 0.8 mg/dL (ref 0.3–1.2)
Total Protein: 6.6 g/dL (ref 6.5–8.1)

## 2020-02-16 LAB — CK: Total CK: >50000 U/L — ABNORMAL HIGH (ref 49–397)

## 2020-02-16 MED ORDER — NICOTINE 14 MG/24HR TD PT24: 14.0000 mg | MEDICATED_PATCH | Freq: Every day | TRANSDERMAL | 0 refills | Status: AC

## 2020-02-16 NOTE — Progress Notes (Signed)
Patient discharged to home. Given all belongings, instructions, letter. Verbalized understanding of instructions. Ambulated to pov.

## 2020-02-16 NOTE — Discharge Summary (Signed)
Physician Discharge Summary  OBRIAN BULSON WCB:[id number] DOB: 1996/03/26 DOA: 02/13/2020  PCP: Pcp, No  Admit date: 02/13/2020 Discharge date: 02/16/2020  Admitted From:home Disposition: home Recommendations for Outpatient Follow-up:  1. Follow up with PCP in 1-2 weeks 2. Please obtain BMP/CBC in one week  Home Health:none Equipment/Devices none Discharge Condition:stable CODE STATUS full code Diet recommendation: Cardiac Brief/Interim Summary:Gregg N McCoyis a 24 y.o.malewithno previous medical history. Present to hospital w/ 24 hours or muscle pain and fatigue. He reports 2 days ago he attended a vigorous work out session with is cousin. He seemed to feel fine the first day, but woke up yesterday with extreme fatigue and pain in his upper torso. I dismissed it as post-workout pain and tried to go to work. However, he was unable to tolerate the pain. He went home and took some ibuprofen, which helped a small amount. This morning he found himself with continued soreness. He reports that he noticed last night that his urine had become dark brown. This continued through this morning. He became concerned that something may be wrong; so, he looked up his symptoms online. He then decided to come to the ED. He denies any other alleviating or aggravating factors.  ED Course:Labwork showed elevated LFTs and CPK. [facility] was called for admission.   Discharge Diagnoses:  Active Problems:   Rhabdomyolysis  #1  Rhabdomyolysis from aggressive workout with elevated CPK over 50,000 with elevated LFTs. AST 986 on discharge down from 1443, ALT 333 on discharge down from 443.  CPKs were still above 50,000.  Encouraged him not to work out for a week and to have plenty of fluids.  Renal functions remained stable throughout the hospital stay his BUN was 8 creatinine was 0.91 on discharge. UA with proteinuria likely secondary to rhabdo.  Patient does not have a primary care physician however he is willing  and capable of finding a PCP who will be in his insurance system.  #2 nicotine dependence-advised to quit  #3 substance abuse-thc positive   Estimated body mass index is 41.51 kg/m as calculated from the following:   Height as of this encounter: 5\' 7"  (1.702 m).   Weight as of this encounter: 120.2 kg.  Discharge Instructions  Discharge Instructions    Diet - low sodium heart healthy   Complete by: As directed    Increase activity slowly   Complete by: As directed      Allergies as of 02/16/2020   No Known Allergies     Medication List    TAKE these medications   nicotine 14 mg/24hr patch Commonly known as: NICODERM CQ - dosed in mg/24 hours Place 1 patch (14 mg total) onto the skin daily.       Follow-up Information    [facility]. Call.   Why: call for "new patient" appointment if unable to secure with a provider in your insurance network Contact information: [address] [place] [phone]             No Known Allergies  Consultations:  none   Procedures/Studies:  No results found. (Echo, Carotid, EGD, Colonoscopy, ERCP)    Subjective: Patient resting in bed no complaints anxious to go home  Discharge Exam: Vitals:   02/15/20 2030 02/16/20 0504  BP: 139/66 (!) 145/72  Pulse: (!) 54 (!) 46  Resp: 16 16  Temp: 98.5 F (36.9 C) 97.9 F (36.6 C)  SpO2: 99% 100%   Vitals:  02/15/20 0500 02/15/20 1344 02/15/20 2030 02/16/20 0504  BP: (!) 150/73 (!) 133/57 139/66 (!) 145/72  Pulse: (!) 51 75 (!) 54 (!) 46  Resp: 16 16 16 16   Temp: 97.8 F (36.6 C) 97.8 F (36.6 C) 98.5 F (36.9 C) 97.9 F (36.6 C)  TempSrc: Oral Oral Oral Oral  SpO2: 99% 99% 99% 100%  Weight:      Height:        General: Pt is alert, awake, not in acute distress Cardiovascular: RRR, S1/S2 +, no rubs, no gallops Respiratory: CTA bilaterally, no wheezing, no rhonchi Abdominal: Soft, NT, ND,  bowel sounds + Extremities: no edema, no cyanosis    The results of significant diagnostics from this hospitalization (including imaging, microbiology, ancillary and laboratory) are listed below for reference.     Microbiology: Recent Results (from the past 240 hour(s))  Respiratory Panel by RT PCR (Flu A&B, Covid) - Nasopharyngeal Swab     Status: None   Collection Time: 02/13/20 12:05 PM   Specimen: Nasopharyngeal Swab  Result Value Ref Range Status   SARS Coronavirus 2 by RT PCR NEGATIVE NEGATIVE Final    Comment: (NOTE) SARS-CoV-2 target nucleic acids are NOT DETECTED.  The SARS-CoV-2 RNA is generally detectable in upper respiratoy specimens during the acute phase of infection. The lowest concentration of SARS-CoV-2 viral copies this assay can detect is 131 copies/mL. A negative result does not preclude SARS-Cov-2 infection and should not be used as the sole basis for treatment or other patient management decisions. A negative result may occur with  improper specimen collection/handling, submission of specimen other than nasopharyngeal swab, presence of viral mutation(s) within the areas targeted by this assay, and inadequate number of viral copies (<131 copies/mL). A negative result must be combined with clinical observations, patient history, and epidemiological information. The expected result is Negative.  Fact Sheet for Patients:  https://www.moore.com/https://www.fda.gov/media/142436/download  Fact Sheet for Healthcare Providers:  https://www.young.biz/https://www.fda.gov/media/142435/download  This test is no t yet approved or cleared by the [place] FDA and  has been authorized for detection and/or diagnosis of SARS-CoV-2 by FDA under an Emergency Use Authorization (EUA). This EUA will remain  in effect (meaning this test can be used) for the duration of the COVID-19 declaration under Section 564(b)(1) of the Act, 21 U.S.C. section 360bbb-3(b)(1), unless the authorization is terminated or revoked  sooner.     Influenza A by PCR NEGATIVE NEGATIVE Final   Influenza B by PCR NEGATIVE NEGATIVE Final    Comment: (NOTE) The Xpert Xpress SARS-CoV-2/FLU/RSV assay is intended as an aid in  the diagnosis of influenza from Nasopharyngeal swab specimens and  should not be used as a sole basis for treatment. Nasal washings and  aspirates are unacceptable for Xpert Xpress SARS-CoV-2/FLU/RSV  testing.  Fact Sheet for Patients: https://www.moore.com/https://www.fda.gov/media/142436/download  Fact Sheet for Healthcare Providers: https://www.young.biz/https://www.fda.gov/media/142435/download  This test is not yet approved or cleared by the [place] FDA and  has been authorized for detection and/or diagnosis of SARS-CoV-2 by  FDA under an Emergency Use Authorization (EUA). This EUA will remain  in effect (meaning this test can be used) for the duration of the  Covid-19 declaration under Section 564(b)(1) of the Act, 21  U.S.C. section 360bbb-3(b)(1), unless the authorization is  terminated or revoked. Performed at [facility], [address] [place], [place] [id number]      Labs: BNP (last 3 results) No results for input(s): BNP in the last 8760 hours. Basic Metabolic Panel: Recent Labs  Lab 02/13/20 1020 02/13/20 1315 02/14/20 0349 02/15/20 0832 02/16/20 0356  NA 139  --  140 141 139  K 4.4  --  4.2 3.9 3.7  CL 102  --  108 105 106  CO2 25  --  24 25 24   GLUCOSE 109*  --  96 95 78  BUN 13  --  10 7 8   CREATININE 1.03 1.03 0.85 0.97 0.91  CALCIUM 8.9  --  8.6* 9.2 8.6*   Liver Function Tests: Recent Labs  Lab 02/13/20 1020 02/14/20 0349 02/15/20 0832 02/16/20 0356  AST 1,449* 1,524* 1,443* 986*  ALT 361* 372* 443* 333*  ALKPHOS 59 47 51 45  BILITOT 0.7 0.4 1.0 0.8  PROT 7.8 6.6 7.4 6.6  ALBUMIN 4.2 3.5 3.8 3.6   No results for input(s): LIPASE, AMYLASE in the last 168 hours. No results for input(s): AMMONIA in the last 168 hours. CBC: Recent Labs  Lab 02/13/20 1020  02/13/20 1315 02/14/20 0349  WBC 7.0 7.0 6.9  NEUTROABS 4.4  --   --   HGB 14.3 13.8 13.4  HCT 41.5 40.6 39.1  MCV 82.8 83.9 83.5  PLT 236 162 197   Cardiac Enzymes: Recent Labs  Lab 02/13/20 1020 02/14/20 0349 02/15/20 0411 02/16/20 0356  CKTOTAL >50,000* >50,000* >50,000* >50,000*   BNP: Invalid input(s): POCBNP CBG: No results for input(s): GLUCAP in the last 168 hours. D-Dimer No results for input(s): DDIMER in the last 72 hours. Hgb A1c No results for input(s): HGBA1C in the last 72 hours. Lipid Profile No results for input(s): CHOL, HDL, LDLCALC, TRIG, CHOLHDL, LDLDIRECT in the last 72 hours. Thyroid function studies No results for input(s): TSH, T4TOTAL, T3FREE, THYROIDAB in the last 72 hours.  Invalid input(s): FREET3 Anemia work up No results for input(s): VITAMINB12, FOLATE, FERRITIN, TIBC, IRON, RETICCTPCT in the last 72 hours. Urinalysis    Component Value Date/Time   COLORURINE YELLOW 02/13/2020 0956   APPEARANCEUR CLEAR 02/13/2020 0956   LABSPEC 1.023 02/13/2020 0956   PHURINE 6.0 02/13/2020 0956   GLUCOSEU NEGATIVE 02/13/2020 0956   HGBUR LARGE (A) 02/13/2020 0956   BILIRUBINUR NEGATIVE 02/13/2020 0956   BILIRUBINUR neg 01/02/2012 1322   KETONESUR NEGATIVE 02/13/2020 0956   PROTEINUR 100 (A) 02/13/2020 0956   UROBILINOGEN 1.0 06/23/2014 2253   NITRITE NEGATIVE 02/13/2020 0956   LEUKOCYTESUR NEGATIVE 02/13/2020 0956   Sepsis Labs Invalid input(s): PROCALCITONIN,  WBC,  LACTICIDVEN Microbiology Recent Results (from the past 240 hour(s))  Respiratory Panel by RT PCR (Flu A&B, Covid) - Nasopharyngeal Swab     Status: None   Collection Time: 02/13/20 12:05 PM   Specimen: Nasopharyngeal Swab  Result Value Ref Range Status   SARS Coronavirus 2 by RT PCR NEGATIVE NEGATIVE Final    Comment: (NOTE) SARS-CoV-2 target nucleic acids are NOT DETECTED.  The SARS-CoV-2 RNA is generally detectable in upper respiratoy specimens during the acute phase of  infection. The lowest concentration of SARS-CoV-2 viral copies this assay can detect is 131 copies/mL. A negative result does not preclude SARS-Cov-2 infection and should not be used as the sole basis for treatment or other patient management decisions. A negative result may occur with  improper specimen collection/handling, submission of specimen other than nasopharyngeal swab, presence of viral mutation(s) within the areas targeted by this assay, and inadequate number of viral copies (<131 copies/mL). A negative result must be combined with clinical observations, patient history, and epidemiological information. The expected result is Negative.  Fact Sheet for  Patients:  https://www.moore.com/  Fact Sheet for Healthcare Providers:  https://www.young.biz/  This test is no t yet approved or cleared by the [place] FDA and  has been authorized for detection and/or diagnosis of SARS-CoV-2 by FDA under an Emergency Use Authorization (EUA). This EUA will remain  in effect (meaning this test can be used) for the duration of the COVID-19 declaration under Section 564(b)(1) of the Act, 21 U.S.C. section 360bbb-3(b)(1), unless the authorization is terminated or revoked sooner.     Influenza A by PCR NEGATIVE NEGATIVE Final   Influenza B by PCR NEGATIVE NEGATIVE Final    Comment: (NOTE) The Xpert Xpress SARS-CoV-2/FLU/RSV assay is intended as an aid in  the diagnosis of influenza from Nasopharyngeal swab specimens and  should not be used as a sole basis for treatment. Nasal washings and  aspirates are unacceptable for Xpert Xpress SARS-CoV-2/FLU/RSV  testing.  Fact Sheet for Patients: https://www.moore.com/  Fact Sheet for Healthcare Providers: https://www.young.biz/  This test is not yet approved or cleared by the [place] FDA and  has been authorized for detection and/or diagnosis of SARS-CoV-2  by  FDA under an Emergency Use Authorization (EUA). This EUA will remain  in effect (meaning this test can be used) for the duration of the  Covid-19 declaration under Section 564(b)(1) of the Act, 21  U.S.C. section 360bbb-3(b)(1), unless the authorization is  terminated or revoked. Performed at [facility], [address] [place], [place] [address]      Time coordinating discharge: 39 minutes  SIGNED:   Alwyn Ren, MD  [facility] 02/16/2020, 8:58 AM
